# Patient Record
Sex: Female | Born: 1965 | Race: Asian | Hispanic: No | Marital: Single | Smoking: Never smoker
Health system: Southern US, Community
[De-identification: ages and names within clinical notes are randomized; demographics above are authoritative.]

## PROBLEM LIST (undated history)

## (undated) DIAGNOSIS — F419 Anxiety disorder, unspecified: Secondary | ICD-10-CM

## (undated) HISTORY — DX: Anxiety disorder, unspecified: F41.9

---

## 1999-04-10 ENCOUNTER — Other Ambulatory Visit: Admission: RE | Admit: 1999-04-10 | Discharge: 1999-04-10 | Payer: Self-pay | Admitting: *Deleted

## 2002-04-26 ENCOUNTER — Other Ambulatory Visit: Admission: RE | Admit: 2002-04-26 | Discharge: 2002-04-26 | Payer: Self-pay | Admitting: Family Medicine

## 2005-10-26 ENCOUNTER — Ambulatory Visit: Payer: Self-pay | Admitting: Family Medicine

## 2006-07-17 ENCOUNTER — Emergency Department (HOSPITAL_COMMUNITY): Admission: EM | Admit: 2006-07-17 | Discharge: 2006-07-17 | Payer: Self-pay | Admitting: Emergency Medicine

## 2007-01-24 ENCOUNTER — Ambulatory Visit: Payer: Self-pay | Admitting: Family Medicine

## 2007-01-24 DIAGNOSIS — L2089 Other atopic dermatitis: Secondary | ICD-10-CM

## 2007-01-24 DIAGNOSIS — N63 Unspecified lump in unspecified breast: Secondary | ICD-10-CM

## 2007-01-24 DIAGNOSIS — B373 Candidiasis of vulva and vagina: Secondary | ICD-10-CM

## 2007-01-24 DIAGNOSIS — N390 Urinary tract infection, site not specified: Secondary | ICD-10-CM

## 2007-01-24 LAB — CONVERTED CEMR LAB
Bilirubin Urine: NEGATIVE
Blood in Urine, dipstick: NEGATIVE
Glucose, Urine, Semiquant: NEGATIVE
Ketones, urine, test strip: NEGATIVE
Protein, U semiquant: NEGATIVE
Specific Gravity, Urine: 1.01
pH: 6

## 2007-01-26 LAB — CONVERTED CEMR LAB
Chlamydia, DNA Probe: NEGATIVE
GC Probe Amp, Genital: NEGATIVE

## 2007-01-30 ENCOUNTER — Telehealth (INDEPENDENT_AMBULATORY_CARE_PROVIDER_SITE_OTHER): Payer: Self-pay | Admitting: *Deleted

## 2007-02-02 ENCOUNTER — Encounter: Admission: RE | Admit: 2007-02-02 | Discharge: 2007-02-02 | Payer: Self-pay | Admitting: Family Medicine

## 2007-02-03 ENCOUNTER — Encounter (INDEPENDENT_AMBULATORY_CARE_PROVIDER_SITE_OTHER): Payer: Self-pay | Admitting: *Deleted

## 2007-02-08 ENCOUNTER — Encounter (INDEPENDENT_AMBULATORY_CARE_PROVIDER_SITE_OTHER): Payer: Self-pay | Admitting: *Deleted

## 2008-09-27 ENCOUNTER — Ambulatory Visit: Payer: Self-pay | Admitting: Family Medicine

## 2008-09-27 DIAGNOSIS — L408 Other psoriasis: Secondary | ICD-10-CM

## 2008-09-27 DIAGNOSIS — J069 Acute upper respiratory infection, unspecified: Secondary | ICD-10-CM | POA: Insufficient documentation

## 2008-10-07 ENCOUNTER — Telehealth (INDEPENDENT_AMBULATORY_CARE_PROVIDER_SITE_OTHER): Payer: Self-pay | Admitting: *Deleted

## 2009-09-30 ENCOUNTER — Ambulatory Visit: Payer: Self-pay | Admitting: Family

## 2009-09-30 ENCOUNTER — Encounter: Payer: Self-pay | Admitting: Family Medicine

## 2009-09-30 DIAGNOSIS — F411 Generalized anxiety disorder: Secondary | ICD-10-CM | POA: Insufficient documentation

## 2009-09-30 DIAGNOSIS — R002 Palpitations: Secondary | ICD-10-CM

## 2009-10-01 ENCOUNTER — Encounter: Payer: Self-pay | Admitting: Family

## 2009-10-01 ENCOUNTER — Encounter: Payer: Self-pay | Admitting: Family Medicine

## 2010-03-25 ENCOUNTER — Ambulatory Visit: Payer: Self-pay | Admitting: Family Medicine

## 2010-03-25 DIAGNOSIS — R209 Unspecified disturbances of skin sensation: Secondary | ICD-10-CM | POA: Insufficient documentation

## 2010-03-25 DIAGNOSIS — R5381 Other malaise: Secondary | ICD-10-CM

## 2010-03-25 DIAGNOSIS — R1310 Dysphagia, unspecified: Secondary | ICD-10-CM | POA: Insufficient documentation

## 2010-03-25 DIAGNOSIS — R079 Chest pain, unspecified: Secondary | ICD-10-CM | POA: Insufficient documentation

## 2010-03-25 DIAGNOSIS — R5383 Other fatigue: Secondary | ICD-10-CM

## 2010-03-25 DIAGNOSIS — R030 Elevated blood-pressure reading, without diagnosis of hypertension: Secondary | ICD-10-CM

## 2010-03-26 ENCOUNTER — Encounter: Payer: Self-pay | Admitting: Family Medicine

## 2010-03-26 LAB — CONVERTED CEMR LAB
ALT: 24 units/L (ref 0–35)
BUN: 14 mg/dL (ref 6–23)
Basophils Relative: 0.4 % (ref 0.0–3.0)
Bilirubin, Direct: 0.1 mg/dL (ref 0.0–0.3)
CO2: 28 meq/L (ref 19–32)
Chloride: 108 meq/L (ref 96–112)
Creatinine, Ser: 0.6 mg/dL (ref 0.4–1.2)
Eosinophils Absolute: 0.1 10*3/uL (ref 0.0–0.7)
Eosinophils Relative: 1.8 % (ref 0.0–5.0)
Free T4: 0.76 ng/dL (ref 0.60–1.60)
HCT: 40 % (ref 36.0–46.0)
Lymphs Abs: 3 10*3/uL (ref 0.7–4.0)
MCHC: 34 g/dL (ref 30.0–36.0)
MCV: 87.3 fL (ref 78.0–100.0)
Monocytes Absolute: 0.4 10*3/uL (ref 0.1–1.0)
Neutrophils Relative %: 52 % (ref 43.0–77.0)
Platelets: 267 10*3/uL (ref 150.0–400.0)
Potassium: 4.5 meq/L (ref 3.5–5.1)
RBC: 4.59 M/uL (ref 3.87–5.11)
T3, Free: 2.8 pg/mL (ref 2.3–4.2)
TSH: 1.82 microintl units/mL (ref 0.35–5.50)
Total Protein: 7.5 g/dL (ref 6.0–8.3)
WBC: 7.3 10*3/uL (ref 4.5–10.5)

## 2010-04-02 ENCOUNTER — Telehealth: Payer: Self-pay | Admitting: Family Medicine

## 2010-04-21 ENCOUNTER — Ambulatory Visit: Payer: Self-pay | Admitting: Family Medicine

## 2010-04-21 DIAGNOSIS — L218 Other seborrheic dermatitis: Secondary | ICD-10-CM

## 2010-04-21 DIAGNOSIS — D179 Benign lipomatous neoplasm, unspecified: Secondary | ICD-10-CM | POA: Insufficient documentation

## 2010-04-22 ENCOUNTER — Telehealth (INDEPENDENT_AMBULATORY_CARE_PROVIDER_SITE_OTHER): Payer: Self-pay | Admitting: *Deleted

## 2010-04-22 ENCOUNTER — Encounter: Payer: Self-pay | Admitting: Family Medicine

## 2010-05-21 ENCOUNTER — Telehealth (INDEPENDENT_AMBULATORY_CARE_PROVIDER_SITE_OTHER): Payer: Self-pay | Admitting: *Deleted

## 2010-06-10 ENCOUNTER — Encounter: Payer: Self-pay | Admitting: Family Medicine

## 2010-08-09 LAB — CONVERTED CEMR LAB
Bilirubin Urine: NEGATIVE
Glucose, Urine, Semiquant: NEGATIVE
TSH: 1.47 microintl units/mL (ref 0.35–5.50)
pH: 6

## 2010-08-11 NOTE — Progress Notes (Signed)
Summary: prior auth APPROVED ELIDEL [facility]  Phone Note Refill Request Message from:  Patient  Refills Requested: Medication #1:  ELIDEL 1 % CREA apply two times a day. [phone] pt ID [id number]. awaiting fax [phone]..................Marland KitchenFelecia Deloach CMA  April 22, 2010 8:44 AM   Initial call taken by: Jeremy Johann CMA,  April 22, 2010 8:31 AM  Follow-up for Phone Call        prior auth faxed back awaiting response...........Marland KitchenFelecia Deloach CMA  April 22, 2010 9:06 AM  prior auth approved 04-21-10 to 04-22-11, pharmacy faxed..............Marland KitchenFelecia Deloach CMA  April 27, 2010 12:01 PM

## 2010-08-11 NOTE — Medication Information (Signed)
Summary: Prior Authorization for Elidel Cream/[facility]  Prior Authorization for Elidel Cream/[facility]   Imported By: Lanelle Bal 05/04/2010 12:43:33  _____________________________________________________________________  External Attachment:    Type:   Image     Comment:   External Document

## 2010-08-11 NOTE — Assessment & Plan Note (Signed)
Summary: LUMP IN HER STOMACH//PH   Vital Signs:  Patient profile:   45 year old female Weight:      159.2 pounds Temp:     98.1 degrees F oral BP sitting:   150 / 90  (right arm) Cuff size:   regular  Vitals Entered By: Almeta Monas CMA Duncan Dull) (April 21, 2010 4:26 PM) CC: c/o lump in the abdomen and pelvic area   History of Present Illness: Pt here c/o lumps on abd ---no pain---they have been there for a while and she is concerned.  Pt also c/o eczema on on  scalp--- she is using elidil and T gel with no relief.    Current Medications (verified): 1)  Clarinex 5 Mg Tabs (Desloratadine) .Marland Kitchen.. 1 By Mouth Once Daily As Needed. 2)  Celexa 20 Mg Tabs (Citalopram Hydrobromide) .... 1/2 Tab By Mouth Daily X 1 Week, Then Increase To One Tablet By Mouth Daily 3)  Elidel 1 % Crea (Pimecrolimus) .... Apply Two Times A Day. 4)  Nexium 40 Mg Cpdr (Esomeprazole Magnesium) .... Take One Tablet Daily 5)  Capex 0.01 % Sham (Fluocinolone Acetonide) .... Massage Into Scalp and Leave On 5 Min --Rinse Can Use Daily  Allergies (verified): No Known Drug Allergies  Past History:  Past medical, surgical, family and social histories (including risk factors) reviewed for relevance to current acute and chronic problems.  Family History: Reviewed history and no changes required.  Social History: Reviewed history and no changes required.  Review of Systems      See HPI  Physical Exam  General:  Well-developed,well-nourished,in no acute distress; alert,appropriate and cooperative throughout examination Head:  + flakey dry scalp Abdomen:  lipomas on abd non tender Psych:  Cognition and judgment appear intact. Alert and cooperative with normal attention span and concentration. 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 1 by mouth once daily as needed. 2)  Celexa 20 Mg Tabs (Citalopram hydrobromide) .... 1/2 tab by mouth daily x 1 week, then increase to one tablet by mouth daily 3)  Elidel 1 % Crea (Pimecrolimus) .... Apply two times a day. 4)  Nexium 40 Mg Cpdr (Esomeprazole magnesium) .... Take one tablet daily 5)  Capex 0.01 % Sham (Fluocinolone acetonide) .... Massage into scalp and leave on 5 min --rinse can use daily Prescriptions: CAPEX 0.01 % SHAM (FLUOCINOLONE ACETONIDE) massage into scalp and leave on 5 min --rinse can use daily  #146ml x 0   Entered and Authorized by:   Loreen Freud DO   Signed by:   Loreen Freud DO on 04/21/2010   Method used:   Electronically to        [facility] #[id number]* (retail)       [address] [place], [place]  [address]       Ph: [phone]       Fax: [phone]   RxID:   [id number]
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
## 2010-08-11 NOTE — Letter (Signed)
   [facility] [address] [place], [place] [address] [phone]   October 01, 2009   [facility] [address] [place], [place] [address]  RE:  LAB RESULTS  Dear  Ms. Belluomini,  The following is an interpretation of your most recent lab tests.  Please take note of any instructions provided or changes to medications that have resulted from your lab work.  THYROID STUDIES:  Thyroid studies normal TSH: 1.47    D dimer test is normal also.   Sincerely Yours,    Lemont Fillers FNP

## 2010-08-11 NOTE — Progress Notes (Signed)
Summary: --med alternative  Phone Note Refill Request Message from:  Pharmacy on [facility] [address]  Refills Requested: Medication #1:  NEXIUM 40 MG CPDR take one tablet daily Prior Auth [id number], ID [id number]..............Marland KitchenFelecia Deloach CMA  May 21, 2010 4:49 PM    Follow-up for Phone Call        left message to call office to see if pt has tried any other meds............Marland KitchenFelecia Deloach CMA  May 22, 2010 11:26 AM    Pt states that she has not tried any other meds except nexium which has help alot. Pt would like to see if med can be approved. awaiting fax case #[id number]........Marland KitchenFelecia Deloach CMA  May 27, 2010 3:48 PM   Additional Follow-up for Phone Call Additional follow up Details #1::        Pt must have experience therapeutic failure with any two covered alternative:omeprazole,zegerid, protonix, pantoprazole, aciphex, kapidex, dexilant.. Pls advise on alternative med......Marland KitchenFelecia Deloach CMA  May 27, 2010 4:32 PM     Additional Follow-up for Phone Call Additional follow up Details #2::    omeprazole 20 mg  #30 1 by mouth once daily 5 refills---inform pt --have her call if it does not work Follow-up by: Loreen Freud DO,  May 28, 2010 8:36 AM  Additional Follow-up for Phone Call Additional follow up Details #3:: Details for Additional Follow-up Action Taken: Left message to call back... Almeta Monas CMA Duncan Dull)  May 28, 2010 11:56 AM  left message to call office...........Marland KitchenFelecia Deloach CMA  May 28, 2010 2:58 PM   Pt aware of med change and rx sent to pharmacy.Pt will call if med not working out..........Marland KitchenFelecia Deloach CMA  May 28, 2010 3:23 PM    New/Updated Medications: OMEPRAZOLE 20 MG TBEC (OMEPRAZOLE) 1 by mouth once daily Prescriptions: OMEPRAZOLE 20 MG TBEC (OMEPRAZOLE) 1 by mouth once daily  #30 x 5   Entered by:   Almeta Monas CMA (AAMA)   Authorized by:   Loreen Freud DO   Signed by:   Almeta Monas  CMA (AAMA) on 05/28/2010   Method used:   Electronically to        [facility] [address] #[id number]* (retail)       [address] [place], [place]  [address]       Ph: [phone]       Fax: [phone]   RxID:   [id number]

## 2010-08-11 NOTE — Progress Notes (Signed)
Summary: Request for Nexium  Phone Note Call from Patient   Caller: Patient Details for Reason: Rf Nexium Summary of Call: Mssg from pt request a RF of nexium. Almeta Monas CMA Duncan Dull)  April 02, 2010 2:35 PM  Call to pt and he was given samples of nexium and she wanted to get a script called to [facility] and holden .Please Advise. Almeta Monas CMA Duncan Dull)  April 02, 2010 2:38 PM   Follow-up for Phone Call        nexium 40 m g #30  1 by mouth once daily  11 refills  Follow-up by: Loreen Freud DO,  April 02, 2010 2:46 PM    New/Updated Medications: NEXIUM 40 MG PACK (ESOMEPRAZOLE MAGNESIUM) 1 by mouth daily Prescriptions: NEXIUM 40 MG PACK (ESOMEPRAZOLE MAGNESIUM) 1 by mouth daily  #30 x 11   Entered by:   Almeta Monas CMA (AAMA)   Authorized by:   Loreen Freud DO   Signed by:   Almeta Monas CMA (AAMA) on 04/02/2010   Method used:   Faxed to ...       [facility] [address] [facility] [address] #[id number]* (retail)       [address] [place], [place]  [address]       Ph: [phone]       Fax: [phone]   RxID:   [id number]   Appended Document: Request for Nexium    Clinical Lists Changes  Medications: Changed medication from NEXIUM 40 MG PACK (ESOMEPRAZOLE MAGNESIUM) 1 by mouth daily to NEXIUM 40 MG CPDR (ESOMEPRAZOLE MAGNESIUM) take one tablet daily

## 2010-08-11 NOTE — Medication Information (Signed)
Summary: Approval for Additional Elidel Cream/[employer] [facility]  Approval for Additional Elidel Cream/[employer]   Imported By: Lanelle Bal 05/01/2010 11:53:59  _____________________________________________________________________  External Attachment:    Type:   Image     Comment:   External Document

## 2010-08-11 NOTE — Assessment & Plan Note (Signed)
Summary: weakness, hands numb//lch   Vital Signs:  Patient profile:   45 year old female Weight:      156.8 pounds Temp:     98.5 degrees F oral Pulse rate:   84 / minute Pulse rhythm:   regular BP sitting:   140 / 80  (right arm)  Vitals Entered By: Almeta Monas CMA Duncan Dull) (March 25, 2010 11:56 AM) CC:  c/o numbness to the hands and feet and lack of energy   History of Present Illness: Pt here c/o extreme fatigue and c/o food getting stuck in throat at times.  Pt also c/o numbness and tingling in hands and feet.  No headaches.  + chest pain last week but it felt like needles in chest and it went across chest and was gone in a second.     Preventive Screening-Counseling & Management  Alcohol-Tobacco     Alcohol drinks/day: 0     Smoking Status: never  Caffeine-Diet-Exercise     Caffeine use/day: 2 20 oz bottles weekly     Does Patient Exercise: no  Current Medications (verified): 1)  Clarinex 5 Mg Tabs (Desloratadine) .Marland Kitchen.. 1 By Mouth Once Daily As Needed. 2)  Celexa 20 Mg Tabs (Citalopram Hydrobromide) .... 1/2 Tab By Mouth Daily X 1 Week, Then Increase To One Tablet By Mouth Daily 3)  Elidel 1 % Crea (Pimecrolimus) .... Apply Two Times A Day.  Allergies (verified): No Known Drug Allergies  Past History:  Past medical, surgical, family and social histories (including risk factors) reviewed for relevance to current acute and chronic problems.  Family History: Reviewed history and no changes required.  Social History: Does Patient Exercise:  no  Review of Systems      See HPI  Physical Exam  General:  Well-developed,well-nourished,in no acute distress; alert,appropriate and cooperative throughout examination Neck:  No deformities, masses, or tenderness noted. Lungs:  Normal respiratory effort, chest expands symmetrically. Lungs are clear to auscultation, no crackles or wheezes. Heart:  normal rate and no murmur.   Msk:  No deformity or scoliosis noted of  thoracic or lumbar spine.   Extremities:  No clubbing, cyanosis, edema, or deformity noted with normal full range of motion of all joints.   Neurologic:  alert & oriented X3, cranial nerves II-XII intact, strength normal in all extremities, and gait normal.   Skin:  Intact without suspicious lesions or rashes Cervical Nodes:  No lymphadenopathy noted Psych:  Cognition and judgment appear intact. Alert and cooperative with normal attention span and concentration. No apparent delusions, illusions, hallucinations   Impression & Recommendations:  Problem # 1:  DYSPHAGIA UNSPECIFIED (ICD-787.20)  Orders: Venipuncture ([address]) TLB-B12 + Folate Pnl (60454_09811-B14/NWG) TLB-BMP (Basic Metabolic Panel-BMET) (80048-METABOL) TLB-CBC Platelet - w/Differential (85025-CBCD) TLB-Hepatic/Liver Function Pnl (80076-HEPATIC) TLB-TSH (Thyroid Stimulating Hormone) (84443-TSH) TLB-T3, Free (Triiodothyronine) (84481-T3FREE) TLB-T4 (Thyrox), Free [phone])  Problem # 2:  FATIGUE (ICD-780.79)  Orders: Venipuncture ([address]) TLB-B12 + Folate Pnl (96295_28413-K44/WNU) TLB-BMP (Basic Metabolic Panel-BMET) (80048-METABOL) TLB-CBC Platelet - w/Differential (85025-CBCD) TLB-Hepatic/Liver Function Pnl (80076-HEPATIC) TLB-TSH (Thyroid Stimulating Hormone) (84443-TSH) TLB-T3, Free (Triiodothyronine) (84481-T3FREE) TLB-T4 (Thyrox), Free [phone])  Problem # 3:  NUMBNESS (ICD-782.0)  Orders: Venipuncture (03474) TLB-B12 + Folate Pnl (25956_38756-E33/IRJ) TLB-BMP (Basic Metabolic Panel-BMET) (80048-METABOL) TLB-CBC Platelet - w/Differential (85025-CBCD) TLB-Hepatic/Liver Function Pnl (80076-HEPATIC) TLB-TSH (Thyroid Stimulating Hormone) (84443-TSH) TLB-T3, Free (Triiodothyronine) (84481-T3FREE) TLB-T4 (Thyrox), Free (18841-YS0Y)  Problem # 4:  ELEVATED BP READING WITHOUT DX HYPERTENSION (ICD-796.2)  BP today: 140/80 Prior BP: 150/90 (09/30/2009)  Instructed in low sodium diet (  DASH Handout) and  behavior modification.    Complete Medication List: 1)  Clarinex 5 Mg Tabs (Desloratadine) .Marland Kitchen.. 1 by mouth once daily as needed. 2)  Celexa 20 Mg Tabs (Citalopram hydrobromide) .... 1/2 tab by mouth daily x 1 week, then increase to one tablet by mouth daily 3)  Elidel 1 % Crea (Pimecrolimus) .... Apply two times a day.  Other Orders: EKG w/ Interpretation (93000)  Patient Instructions: 1)  Check your  Blood Pressure regularly . If it is above:  130/85  you should make an appointment.---- 2)  Please schedule a follow-up appointment in 2 weeks.  3)  Limit your Sodium(salt) .

## 2010-08-13 NOTE — Medication Information (Signed)
Summary: Denial for Nexium/[facility]  Denial for Nexium/[employer]   Imported By: Lanelle Bal 06/18/2010 15:50:23  _____________________________________________________________________  External Attachment:    Type:   Image     Comment:   External Document

## 2010-10-09 ENCOUNTER — Other Ambulatory Visit: Payer: Self-pay | Admitting: Family Medicine

## 2010-10-09 MED ORDER — DESLORATADINE 5 MG PO TABS: 5.0000 mg | ORAL_TABLET | Freq: Every day | ORAL | Status: DC

## 2010-10-19 ENCOUNTER — Telehealth: Payer: Self-pay | Admitting: *Deleted

## 2010-10-19 NOTE — Telephone Encounter (Signed)
In process [facility] awaiting fax.

## 2010-10-20 NOTE — Telephone Encounter (Signed)
Left message to call office to verify if Pt has ever tried levoceterizine generic Xyzal.

## 2010-10-21 NOTE — Telephone Encounter (Signed)
Left message to call office

## 2010-10-22 ENCOUNTER — Other Ambulatory Visit: Payer: Self-pay | Admitting: Family Medicine

## 2010-10-22 ENCOUNTER — Telehealth: Payer: Self-pay | Admitting: Family Medicine

## 2010-10-22 NOTE — Telephone Encounter (Signed)
Left message to call office

## 2010-10-22 NOTE — Telephone Encounter (Signed)
Patient is having a urinary problem, feels heavy pressure, burning, no appts available today or tomorrow---she uses [facility], [address]

## 2010-10-22 NOTE — Telephone Encounter (Signed)
Pt advise OV will be needed to check urine for infection and med cannot be Rx with OV. Pt offer OV for tomorrow as well as inform her about Saturday clinic. Pt decline both stating that she has to work and is unable to come in. Pt also advise UC. Pt states that she will call back later for OV.

## 2010-10-27 ENCOUNTER — Encounter: Payer: Self-pay | Admitting: *Deleted

## 2010-10-27 NOTE — Telephone Encounter (Signed)
Left message to call office, unable to reach Pt after several attempts Letter mailed to contact office.  Will hold off on PA until I hear from Pt due to lack of info unable to proceed with PA. Now.

## 2010-11-12 ENCOUNTER — Encounter: Payer: Self-pay | Admitting: Family Medicine

## 2010-11-13 ENCOUNTER — Ambulatory Visit (INDEPENDENT_AMBULATORY_CARE_PROVIDER_SITE_OTHER): Payer: 59 | Admitting: Family Medicine

## 2010-11-13 ENCOUNTER — Encounter: Payer: Self-pay | Admitting: Family Medicine

## 2010-11-13 VITALS — BP 128/74 | Temp 98.6°F | Wt 152.4 lb

## 2010-11-13 DIAGNOSIS — N390 Urinary tract infection, site not specified: Secondary | ICD-10-CM

## 2010-11-13 DIAGNOSIS — R7309 Other abnormal glucose: Secondary | ICD-10-CM

## 2010-11-13 DIAGNOSIS — R739 Hyperglycemia, unspecified: Secondary | ICD-10-CM

## 2010-11-13 LAB — POCT URINALYSIS DIPSTICK
Ketones, UA: NEGATIVE
Spec Grav, UA: 1.02
Urobilinogen, UA: NEGATIVE
pH, UA: 5

## 2010-11-13 LAB — GLUCOSE, POCT (MANUAL RESULT ENTRY): POC Glucose: 137

## 2010-11-13 MED ORDER — PHENAZOPYRIDINE HCL 200 MG PO TABS: 200.0000 mg | ORAL_TABLET | Freq: Three times a day (TID) | ORAL | Status: AC | PRN

## 2010-11-13 MED ORDER — NITROFURANTOIN MONOHYD MACRO 100 MG PO CAPS: 100.0000 mg | ORAL_CAPSULE | Freq: Two times a day (BID) | ORAL | Status: AC

## 2010-11-13 NOTE — Progress Notes (Signed)
  Subjective:    Patient ID: Alyssa Pham, female    DOB: Jun 17, 1966, 45 y.o.   MRN: [id number]  HPI    Review of Systems     Objective:   Physical Exam        Assessment & Plan:   Subjective:    Alyssa Pham is a 45 y.o. female who complains of burning with urination. She has had symptoms for several days. Patient also complains of stomach ache. Patient denies back pain and vaginal discharge. Patient does have a history of recurrent UTI. Patient does not have a history of pyelonephritis.   The following portions of the patient's history were reviewed and updated as appropriate: allergies, current medications, past family history, past medical history, past social history, past surgical history and problem list.  Review of Systems Pertinent items are noted in HPI.    Objective:    BP 128/74  Temp(Src) 98.6 F (37 C) (Oral)  Wt 152 lb 6.4 oz (69.128 kg) General appearance: alert, cooperative, appears stated age and no distress Lungs: clear to auscultation bilaterally Heart: regular rate and rhythm, S1, S2 normal, no murmur, click, rub or gallop Abdomen: soft, non-tender; bowel sounds normal; no masses,  no organomegaly Extremities: extremities normal, atraumatic, no cyanosis or edema  Laboratory:  Urine dipstick: trace for glucose, negative for hemoglobin, negative for ketones, negative for leukocyte esterase, negative for nitrites, negative for protein and negative for urobilinogen.   Micro exam: not done.    Assessment:    dysuria and elevated glucose     Plan:    Medications: nitrofurantoin. Maintain adequate hydration. Follow up if symptoms not improving, and as needed.

## 2010-11-13 NOTE — Patient Instructions (Signed)
Hyperglycemia   (High Blood Glucose)   Hyperglycemia occurs when the glucose (sugar) in your blood is too high. Hyperglycemia can happen for many reasons, but it most often happens to people who do not know they have diabetes or are not managing their diabetes properly.   CAUSES   Whether you have diabetes or not, there are other causes of hyperglycemia. Hyperglycemia can occur when you have diabetes, but it can also occur in other situations that you might not be as aware of, such as:   Diabetes   If you have diabetes and are having problems controlling your blood glucose, hyperglycemia could occur because of some of the following reasons:   Not following your meal plan.   Not taking your diabetes medications or not taking it properly.   Exercising less or doing less activity than you normally do.   Being sick.   Pre-diabetes   This cannot be ignored. Before people develop Type 2 diabetes, they almost always have “pre-diabetes.” This is when your blood glucose levels are higher than normal, but not yet high enough to be diagnosed as diabetes. Research has shown that some long-term damage to the body, especially the heart and circulatory system, may already be occurring during pre-diabetes. If you take action to manage your blood glucose when you have pre-diabetes, you may delay or prevent Type 2 diabetes from developing.   Stress   If you have diabetes, you may be “diet” controlled or on oral medications or insulin to control your diabetes. However, you may find that your blood glucose is higher than usual in the hospital whether you have diabetes or not. This is often referred to as “stress hyperglycemia.” Stress can elevate your blood glucose. This happens because of hormones put out by the body during times of stress. If stress has been the cause of your high blood glucose, it can be followed regularly by your caregiver. That way he/she can make sure your hyperglycemia does not continue to get worse or progress  to diabetes.   Steroids   Steroids are medications that act on the infection fighting system (immune system) to block inflammation or infection. One side effect can be a rise in blood glucose. Most people can produce enough extra insulin to allow for this rise, but for those who cannot, steroids make blood glucose levels go even higher. It is not unusual for steroid treatments to “uncover” diabetes that is developing. It is not always possible to determine if the hyperglycemia will go away after the steroids are stopped. A special blood test called an A1c is sometimes done to determine if your blood glucose was elevated before the steroids were started.   SYMPTOMS OF HYPERGLYCEMIA   Thirsty.   Frequent urination.   Dry mouth.   Blurred vision.   Tired or fatigue.   Weakness.   Sleepy.   Tingling in feet or leg.   DIAGNOSIS   Diagnosis is made by monitoring blood glucose in one or all of the following ways:   A1c test. This is a chemical found in your blood.   Fingerstick blood glucose monitoring.   Laboratory results.   TREATMENT   First, knowing the cause of the hyperglycemia is important before the hyperglycemia can be treated. Treatment may include, but is not be limited to:   Education.   Change or adjustment in medications.   Change or adjustment in meal plan.   Treatment for an illness, infection, etc.   More frequent blood glucose   monitoring.   Change in exercise plan.   Decreasing or stopping steroids.   Lifestyle changes.   HOME CARE INSTRUCTIONS   Test your blood glucose as directed.   Exercise regularly. Your caregiver will give you instructions about exercise. Pre-diabetes or diabetes which comes on with stress is helped by exercising.   Eat wholesome, balanced meals. Eat often and at regular, fixed times. Your caregiver or nutritionist will give you a meal plan to guide your sugar intake.   Being at an ideal weight is important. If needed, losing as little as 10 to 15 pounds may help improve blood  glucose levels.   SEEK MEDICAL CARE IF:   You have questions about medicine, activity, or diet.   You continue to have symptoms (problems such as increased thirst, urination, or weight gain).   SEEK IMMEDIATE MEDICAL CARE IF:   You are vomiting or have diarrhea.   Your breath smells fruity.   You are breathing faster or slower.   You are very sleepy or incoherent.   You have numbness, tingling, or pain in your feet or hands.   You have chest pain.   Your symptoms get worse even though you have been following your caregiver's orders.   If you have any other questions or concerns.   Document Released: 12/22/2000 Document Re-Released: 04/25/2009   ExitCare® Patient Information ©2011 [employer].

## 2010-11-17 ENCOUNTER — Other Ambulatory Visit (INDEPENDENT_AMBULATORY_CARE_PROVIDER_SITE_OTHER): Payer: 59

## 2010-11-17 DIAGNOSIS — R7309 Other abnormal glucose: Secondary | ICD-10-CM

## 2010-11-17 LAB — BASIC METABOLIC PANEL
BUN: 8 mg/dL (ref 6–23)
CO2: 30 mEq/L (ref 19–32)
Calcium: 9.6 mg/dL (ref 8.4–10.5)
Chloride: 107 mEq/L (ref 96–112)
Creatinine, Ser: 0.9 mg/dL (ref 0.4–1.2)
Glucose, Bld: 122 mg/dL — ABNORMAL HIGH (ref 70–99)

## 2010-11-17 LAB — MICROALBUMIN / CREATININE URINE RATIO: Microalb, Ur: 1 mg/dL (ref 0.0–1.9)

## 2010-11-20 ENCOUNTER — Telehealth: Payer: Self-pay | Admitting: *Deleted

## 2010-11-20 NOTE — Telephone Encounter (Addendum)
Left message to call office  Message copied by Candie Echevaria on Fri Nov 20, 2010 12:09 PM ------      Message from: Loreen Freud      Created: Thu Nov 19, 2010 10:02 PM       Glucose / hgba1c  Elevated----watch simple sugars and starches---- we can refer to nutrition if pt agrees      Recheck 3 months----790.6  Bmp, hgba1c, liver , lipid

## 2010-11-24 NOTE — Telephone Encounter (Signed)
Left message to call office

## 2010-11-25 NOTE — Telephone Encounter (Signed)
mssg left to contact the office    KP 

## 2010-11-27 ENCOUNTER — Encounter: Payer: Self-pay | Admitting: *Deleted

## 2010-11-27 NOTE — Telephone Encounter (Signed)
Left message to call office. Letter mail to Pt advising to contact office after several unsuccessful messages to return call.

## 2010-12-02 ENCOUNTER — Other Ambulatory Visit: Payer: Self-pay | Admitting: Family Medicine

## 2010-12-10 NOTE — Telephone Encounter (Signed)
Pt return call decline nutritionist will work of diet and exercise on her own for now.

## 2011-01-28 ENCOUNTER — Other Ambulatory Visit: Payer: Self-pay | Admitting: Family Medicine

## 2011-03-09 ENCOUNTER — Other Ambulatory Visit: Payer: Self-pay | Admitting: Family Medicine

## 2011-04-29 ENCOUNTER — Encounter: Payer: Self-pay | Admitting: Family Medicine

## 2011-04-29 ENCOUNTER — Ambulatory Visit (INDEPENDENT_AMBULATORY_CARE_PROVIDER_SITE_OTHER): Payer: 59 | Admitting: Family Medicine

## 2011-04-29 ENCOUNTER — Other Ambulatory Visit: Payer: Self-pay | Admitting: Family Medicine

## 2011-04-29 DIAGNOSIS — R319 Hematuria, unspecified: Secondary | ICD-10-CM

## 2011-04-29 DIAGNOSIS — R109 Unspecified abdominal pain: Secondary | ICD-10-CM

## 2011-04-29 DIAGNOSIS — R300 Dysuria: Secondary | ICD-10-CM

## 2011-04-29 LAB — POCT URINALYSIS DIPSTICK
Glucose, UA: NEGATIVE
Ketones, UA: NEGATIVE
Leukocytes, UA: NEGATIVE
Protein, UA: NEGATIVE
Spec Grav, UA: 1.01

## 2011-04-29 MED ORDER — CIPROFLOXACIN HCL 500 MG PO TABS: 500.0000 mg | ORAL_TABLET | Freq: Two times a day (BID) | ORAL | Status: AC

## 2011-04-29 MED ORDER — HYDROCODONE-ACETAMINOPHEN 7.5-750 MG PO TABS: 1.0000 | ORAL_TABLET | Freq: Four times a day (QID) | ORAL | Status: AC | PRN

## 2011-04-29 NOTE — Patient Instructions (Signed)

## 2011-04-29 NOTE — Progress Notes (Signed)
  Subjective:    Patient ID: Alyssa Pham, female    DOB: 1965-09-13, 45 y.o.   MRN: [id number]  HPI Pt here c/o hematuria about 2 weeks ago with low abd pain.  Pt still has some pressure, frequency---but she has not seen any more blood.  No other symptoms.   Review of Systems As above    Objective:   Physical Exam  Constitutional: She appears well-developed and well-nourished.  Pulmonary/Chest: Effort normal.  Abdominal:       + suprapubic tenderness  No rebound, guarding  Psychiatric: She has a normal mood and affect. Her behavior is normal. Judgment and thought content normal.          Assessment & Plan:  Hematuria, frequency and suprapubic pain0----check urine culture,  Strain urine, check CT r/o kidney stones,  vicodin es for pain prn,  cipro 500 mg bid for 5 days

## 2011-04-30 ENCOUNTER — Ambulatory Visit (INDEPENDENT_AMBULATORY_CARE_PROVIDER_SITE_OTHER)
Admission: RE | Admit: 2011-04-30 | Discharge: 2011-04-30 | Disposition: A | Discharge status: Home / Self Care | Payer: 59 | Source: Ambulatory Visit | Attending: Family Medicine | Admitting: Family Medicine

## 2011-04-30 DIAGNOSIS — R109 Unspecified abdominal pain: Secondary | ICD-10-CM

## 2011-04-30 DIAGNOSIS — R319 Hematuria, unspecified: Secondary | ICD-10-CM

## 2011-04-30 DIAGNOSIS — R300 Dysuria: Secondary | ICD-10-CM

## 2011-05-02 LAB — URINE CULTURE: Colony Count: >=100000

## 2011-07-12 ENCOUNTER — Other Ambulatory Visit: Payer: Self-pay | Admitting: Family Medicine

## 2011-10-08 ENCOUNTER — Other Ambulatory Visit: Payer: Self-pay | Admitting: Family Medicine

## 2011-10-29 ENCOUNTER — Telehealth: Payer: Self-pay | Admitting: Family Medicine

## 2011-10-29 MED ORDER — DESLORATADINE 5 MG PO TABS: 5.0000 mg | ORAL_TABLET | Freq: Every day | ORAL | Status: DC

## 2011-10-29 MED ORDER — FLUOCINOLONE ACETONIDE 0.01 % EX SHAM: MEDICATED_SHAMPOO | CUTANEOUS | Status: DC

## 2011-10-29 NOTE — Telephone Encounter (Signed)
Rx sent 

## 2011-10-29 NOTE — Telephone Encounter (Signed)
Left voicemail to call regarding her prescriptions [phone]

## 2011-12-27 ENCOUNTER — Other Ambulatory Visit: Payer: Self-pay | Admitting: Family Medicine

## 2011-12-27 MED ORDER — FLUOCINOLONE ACETONIDE 0.01 % EX SHAM: MEDICATED_SHAMPOO | CUTANEOUS | Status: DC

## 2011-12-27 NOTE — Telephone Encounter (Signed)
Faxed.   KP 

## 2011-12-27 NOTE — Telephone Encounter (Signed)
Refill [facility]x [facility] to [facility] [address]-as [facility] does not offer this item any longer. It is now special order and cost is $380.00 Last wrt 4.19.13 as  Fluocinolone Acetonide (Shampoo) Fluocinolone Acetonide 0.01 %  MASSAGE INTO THE SCALP AND LEAVE ON FOR THEN RINSE, USE DAILY Last ov 10.18.12

## 2012-01-05 MED ORDER — FLUOCINOLONE ACETONIDE 0.01 % EX OIL: TOPICAL_OIL | Freq: Three times a day (TID) | CUTANEOUS | Status: AC

## 2012-01-05 NOTE — Telephone Encounter (Signed)
Let pt know capex no  Longer available per pharmacy--- and ask if she wants the oil instead.

## 2012-01-05 NOTE — Telephone Encounter (Signed)
Spoke with patient and she was ok with trying the scalp oil. Rx faxed, patient will check her schedule and call back to schedule an apt.    KP

## 2012-01-05 NOTE — Addendum Note (Signed)
Addended by: Arnette Norris on: 01/05/2012 08:50 AM   Modules accepted: Orders

## 2012-01-05 NOTE — Telephone Encounter (Signed)
Please advise      KP 

## 2012-01-05 NOTE — Telephone Encounter (Signed)
Fluocinolne acetonide (CAPEX) 0.01 % SHAM-NOT AVAILABLE, per fax received from [id number], they want to know if they can give patient Scalp Oil instead, please review and send new rx if approved [phone]

## 2012-02-18 ENCOUNTER — Encounter: Payer: Self-pay | Admitting: Family Medicine

## 2012-02-18 ENCOUNTER — Telehealth: Payer: Self-pay | Admitting: Family Medicine

## 2012-02-18 ENCOUNTER — Ambulatory Visit (INDEPENDENT_AMBULATORY_CARE_PROVIDER_SITE_OTHER): Payer: PRIVATE HEALTH INSURANCE | Admitting: Family Medicine

## 2012-02-18 VITALS — BP 130/90 | HR 85 | Temp 98.2°F | Wt 145.8 lb

## 2012-02-18 DIAGNOSIS — R82998 Other abnormal findings in urine: Secondary | ICD-10-CM

## 2012-02-18 DIAGNOSIS — N949 Unspecified condition associated with female genital organs and menstrual cycle: Secondary | ICD-10-CM

## 2012-02-18 DIAGNOSIS — R102 Pelvic and perineal pain: Secondary | ICD-10-CM

## 2012-02-18 LAB — POCT URINALYSIS DIPSTICK
Ketones, UA: NEGATIVE
Protein, UA: NEGATIVE
Spec Grav, UA: <=1.005
Urobilinogen, UA: 0.2
pH, UA: 6.5

## 2012-02-18 MED ORDER — CIPROFLOXACIN HCL 500 MG PO TABS: 500.0000 mg | ORAL_TABLET | Freq: Two times a day (BID) | ORAL | Status: AC

## 2012-02-18 NOTE — Telephone Encounter (Signed)
Caller: Alyssa Pham/Patient; Patient Name: Alyssa Pham; PCP: Lelon Perla.; Best Callback Phone Number: [phone].   Health history, medications, and allergies reviewed: Yes in EPIC.  Reason for call with symptoms and associated symptoms: Caller who is at work, would like an appt and was sent to Triage. Caller reports heavy pressure and heaviness "way low in her private area." Pain in lower abd when questioned further. Pain at 5/10 at present. Fatigued, no energy and feeling sleepy. Last BM was this am.  Date of onset of symptoms: Monday 02/14/12 Any treatments/meds tried for symptoms and did they work, (please document med, dosage and time): nothing Last Menstrual Period: 3 weeks ago Guideline viewed: Abd Pain Disposition: See in 24 hrs, Appt scheduled for today, Friday at 16:15, assisted by [place] in office. Caller is agreeable and appx 5 mins from office.

## 2012-02-18 NOTE — Telephone Encounter (Signed)
Noted pt on schedule to see MD Lowne today at 4:15pm

## 2012-02-18 NOTE — Progress Notes (Signed)
  Subjective:    Alyssa Pham is a 46 y.o. female who complains of burning with urination, frequency, suprapubic pressure and urgency. She has had symptoms for 7 days. Patient also complains of back pain. Patient denies congestion, cough, fever, headache, rhinitis, sorethroat and vaginal discharge. Patient does not have a history of recurrent UTI. Patient does not have a history of pyelonephritis.   The following portions of the patient's history were reviewed and updated as appropriate: allergies, current medications, past family history, past medical history, past social history, past surgical history and problem list.  Review of Systems Pertinent items are noted in HPI.    Objective:    BP 130/90  Pulse 85  Temp 98.2 F (36.8 C) (Oral)  Wt 145 lb 12.8 oz (66.134 kg)  SpO2 97% General appearance: alert, cooperative, appears stated age and no distress Abdomen: abnormal findings:  mild tenderness in the lower abdomen  Laboratory:  Urine dipstick: trace for hemoglobin and trace for leukocyte esterase.   Micro exam: not done.    Assessment:    dysuria     Plan:    Medications: ciprofloxacin. Maintain adequate hydration. Follow up if symptoms not improving, and as needed.

## 2012-02-18 NOTE — Patient Instructions (Addendum)

## 2012-03-29 ENCOUNTER — Telehealth: Payer: Self-pay | Admitting: Family Medicine

## 2012-03-29 NOTE — Telephone Encounter (Signed)
Pt declined appt, stated there are only 2-people at her work and she gets no lunch and does not get of till 6pm, she will go to [facility]

## 2012-03-29 NOTE — Telephone Encounter (Signed)
LM ON TRIAGE LINE 11AM 9.18.13 returning your call

## 2012-03-29 NOTE — Telephone Encounter (Signed)
Pt c/o UTI symptoms again as last month, is going to the bathroom often and uncomfortable when she goes.  Having pressure also. Is drinking cranberry juice.

## 2012-03-29 NOTE — Telephone Encounter (Signed)
Needs ov

## 2012-03-29 NOTE — Telephone Encounter (Signed)
Patient can come in to do a UA in the lab per Dr.Lowne--please offer her and apt.      KP

## 2012-03-29 NOTE — Telephone Encounter (Signed)
Left msg for pt to call. 

## 2012-03-29 NOTE — Telephone Encounter (Signed)
Pt left vm on triage line stating she needs more UTI meds called into [facility]. She is having the same symptoms that she had in August. Call back # [phone]

## 2012-05-25 IMAGING — CT CT ABD-PELV W/O CM
2 of 4 series · 17 of 46 positions shown, 19 images · non-contrast
Comparison: None

CLINICAL DATA: Hematuria and painful urination.

CT ABDOMEN AND PELVIS WITHOUT CONTRAST
TECHNIQUE: Multidetector CT imaging of the abdomen and pelvis was
performed following the standard protocol without intravenous
contrast.

[Series 2: ap stone study · axial · 0.67mm/px · z∈[-488,-64]mm · 14 of 93 slices shown, 16 images]
[im 4/93  soft-tissue]
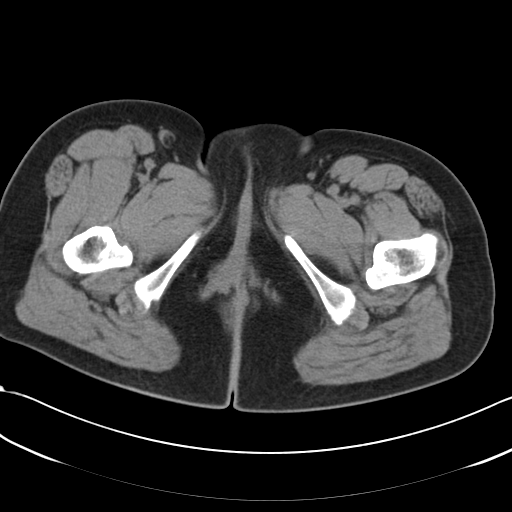
[im 4/93  bone]
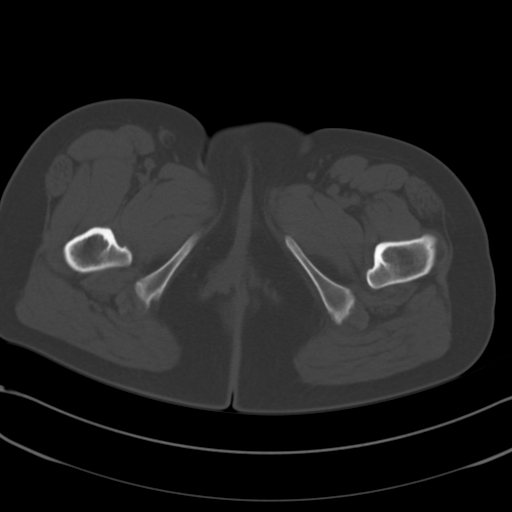
[im 11/93  soft-tissue]
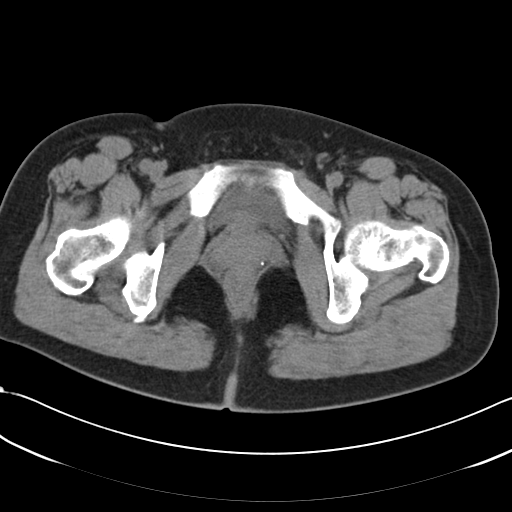
[im 18/93  soft-tissue]
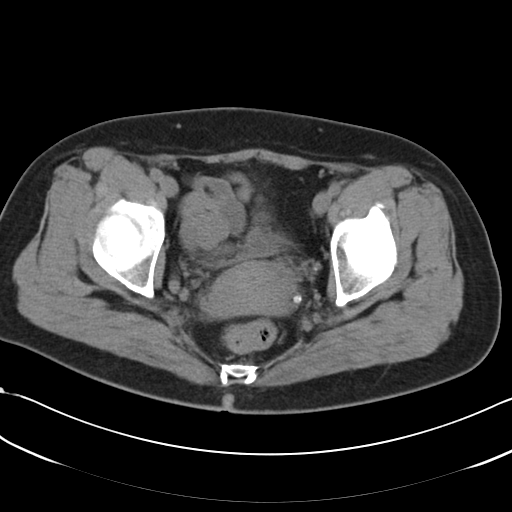
[im 24/93  soft-tissue]
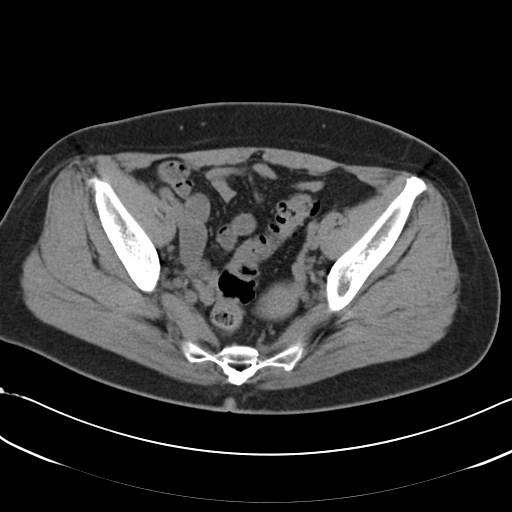
[im 31/93  soft-tissue]
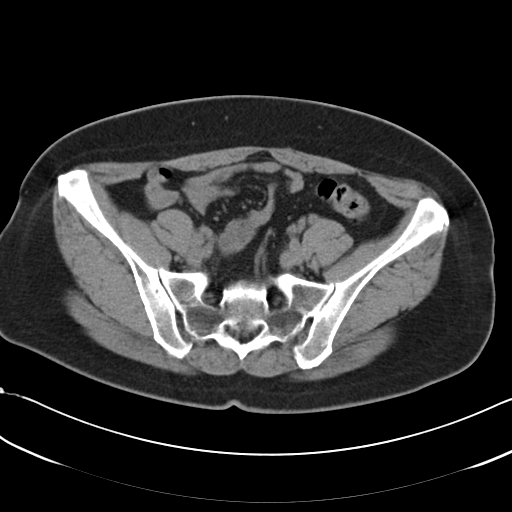
[im 38/93  soft-tissue]
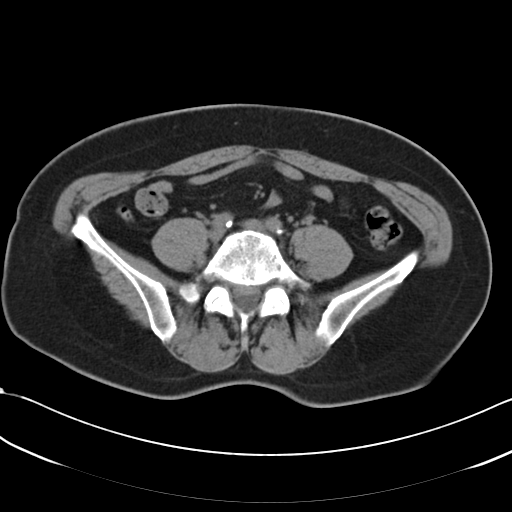
[im 45/93  soft-tissue]
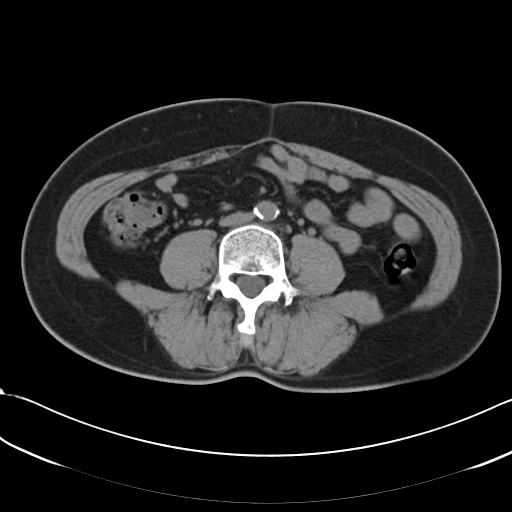
[im 48/93  soft-tissue]
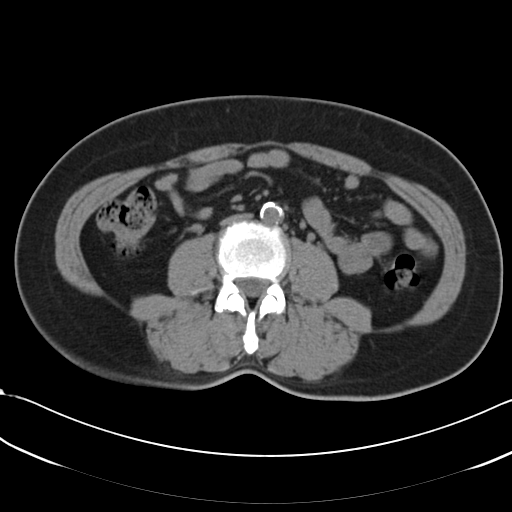
[im 55/93  soft-tissue]
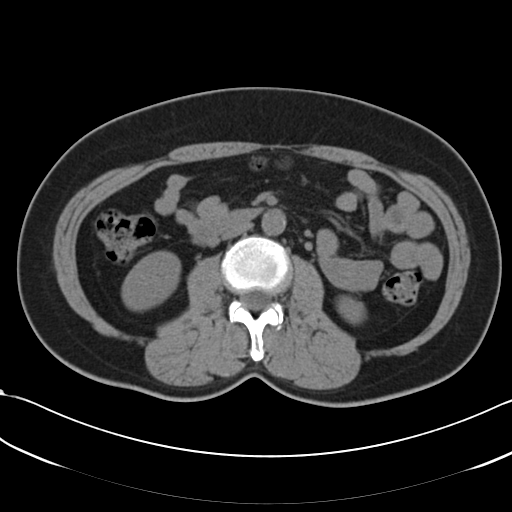
[im 55/93  bone]
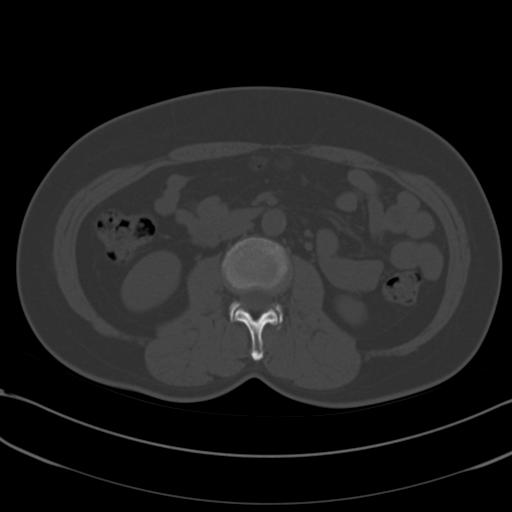
[im 62/93  soft-tissue]
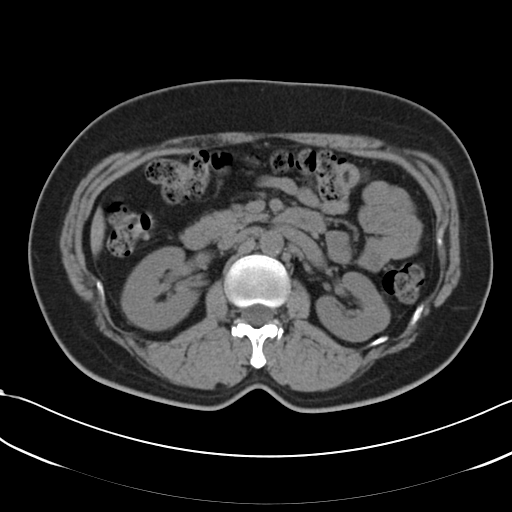
[im 69/93  soft-tissue]
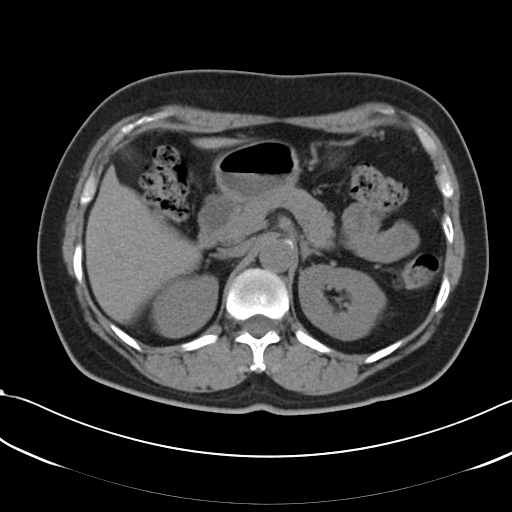
[im 75/93  soft-tissue]
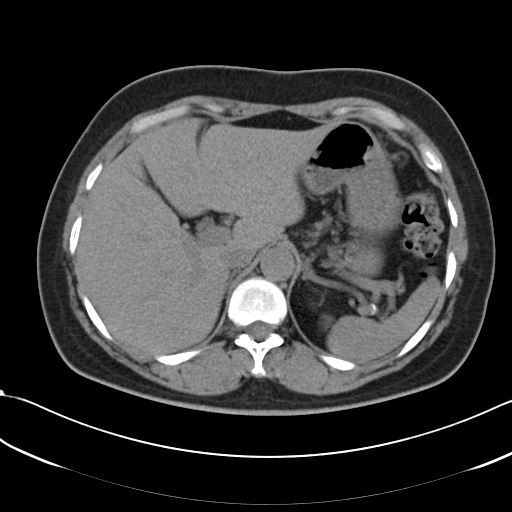
[im 82/93  soft-tissue]
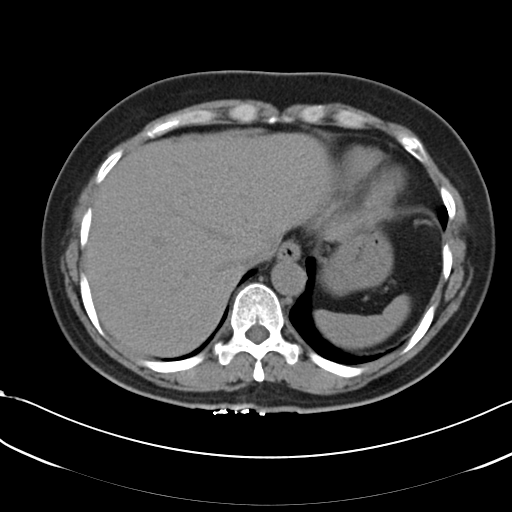
[im 89/93  soft-tissue]
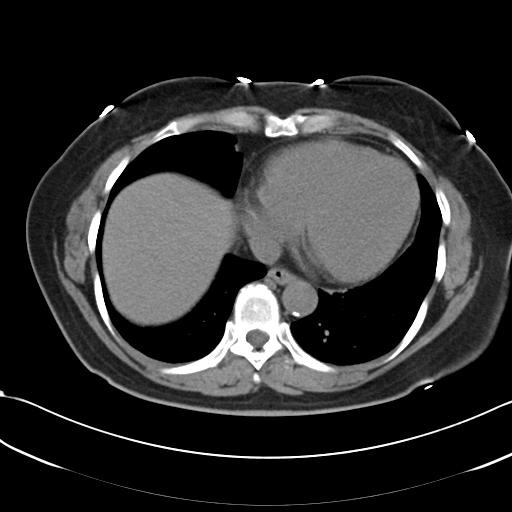

[Series 602: <mpr range> · coronal · 0.94mm/px · 3 of 91 slices shown]
[im 31/91  soft-tissue]
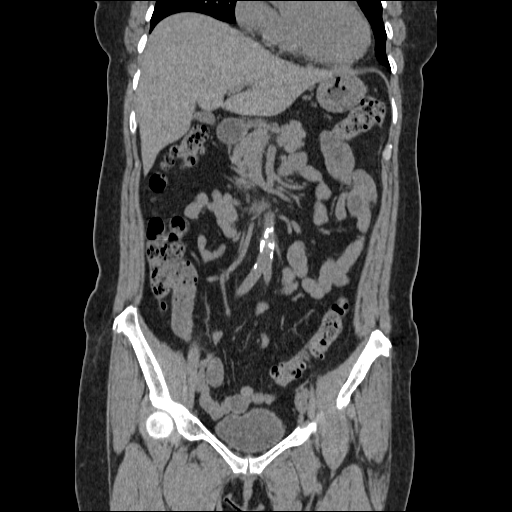
[im 41/91  soft-tissue]
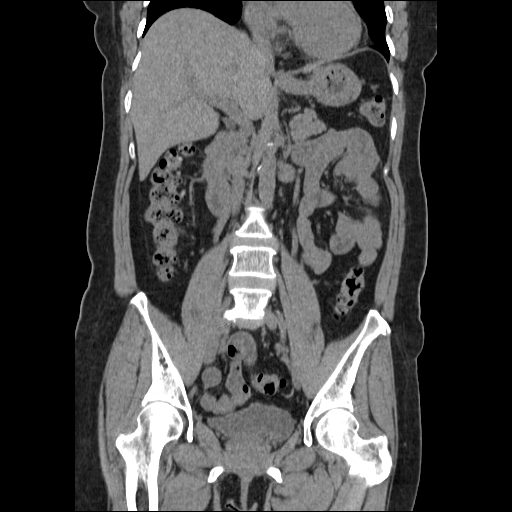
[im 51/91  soft-tissue]
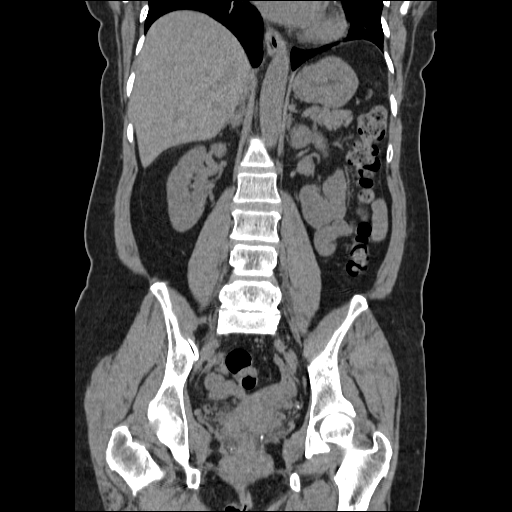

[17 of 46 positions shown; findings below may reference images not displayed]

FINDINGS: The lung bases are clear except for dependent atelectasis
and mild vascular crowding.

The unenhanced appearance of the liver is unremarkable.  No focal
lesions or biliary dilatation.  The gallbladder is normal.  No
common bile duct dilatation.  The pancreas is normal.  The spleen
is normal in size.  No focal lesions.  The adrenal glands and
kidneys are normal.  No renal calculi or obstructing ureteral
calculi.  Calcifications are noted in the aorta and iliac vessels.
The bladder is unremarkable.  No mass lesion or bladder calculi.

The stomach, duodenum, small bowel and colon are grossly normal
without oral contrast.  The appendix is normal.  No mesenteric or
retroperitoneal masses or lymphadenopathy.  The aorta is normal in
caliber.  The major branch vessels are grossly normal.

The uterus and ovaries are unremarkable.  No pelvic mass,
adenopathy or free pelvic fluid collections.  No inguinal mass or
hernia.

The bony structures are unremarkable.
IMPRESSION: 1.  No acute abdominal/pelvic findings, mass lesions or adenopathy.
2.  No renal or obstructing ureteral calculi.

## 2012-09-08 ENCOUNTER — Other Ambulatory Visit: Payer: Self-pay | Admitting: Family Medicine

## 2013-08-14 ENCOUNTER — Ambulatory Visit: Payer: PRIVATE HEALTH INSURANCE | Admitting: Family Medicine

## 2013-10-31 ENCOUNTER — Encounter: Payer: Self-pay | Admitting: *Deleted

## 2013-10-31 ENCOUNTER — Ambulatory Visit (INDEPENDENT_AMBULATORY_CARE_PROVIDER_SITE_OTHER): Payer: PRIVATE HEALTH INSURANCE | Admitting: Nurse Practitioner

## 2013-10-31 VITALS — HR 85 | Temp 99.2°F | Wt 157.0 lb

## 2013-10-31 DIAGNOSIS — R05 Cough: Secondary | ICD-10-CM

## 2013-10-31 DIAGNOSIS — R059 Cough, unspecified: Secondary | ICD-10-CM

## 2013-10-31 MED ORDER — BENZONATATE 100 MG PO CAPS: ORAL_CAPSULE | ORAL | Status: AC

## 2013-10-31 MED ORDER — DESLORATADINE 5 MG PO TABS: 5.0000 mg | ORAL_TABLET | Freq: Every day | ORAL | Status: AC

## 2013-10-31 NOTE — Progress Notes (Signed)
Pre-visit discussion using our clinic review tool. No additional management support is needed unless otherwise documented below in the visit note.  

## 2013-10-31 NOTE — Patient Instructions (Signed)
You likely have flu. The average duration is 5-10 days. Treatment is largely symptom management.  For sinus congestion, start daily sinus rinses (neilmed Sinus Rinse) & 30 mg to 60 mg pseudoephedrine twice daily.  For sore throat use benzocaine throat lozenges or spray.  For aches & fever alternate tylenol & ibuprophen every 4-6 hours.  For cough, you may use a spoonful of honey thinned with lemon juice or hot tea, or benzonatate capsules as prescribed. Sip fluids every hour. Rest. If you are not feeling better in 1 week or develop fever or chest pain, call us for re-evaluation. Feel better!  Influenza A (H1N1) H1N1 formerly called "swine flu" is a new influenza virus causing sickness in people. The H1N1 virus is different from seasonal influenza viruses. However, the H1N1 symptoms are similar to seasonal influenza and it is spread from person to person. You may be at higher risk for serious problems if you have underlying serious medical conditions. The CDC and the World Health Organization are following reported cases around the world. CAUSES   The flu is thought to spread mainly person-to-person through coughing or sneezing of infected people.  A person may become infected by touching something with the virus on it and then touching their mouth or nose. SYMPTOMS   Fever.  Headache.  Tiredness.  Cough.  Sore throat.  Runny or stuffy nose.  Body aches.  Diarrhea and vomiting These symptoms are referred to as "flu-like symptoms." A lot of different illnesses, including the common cold, may have similar symptoms. DIAGNOSIS   There are tests that can tell if you have the H1N1 virus.  Confirmed cases of H1N1 will be reported to the state or local health department.  A doctor's exam may be needed to tell whether you have an infection that is a complication of the flu. HOME CARE INSTRUCTIONS   Stay informed. Visit the CDC website for current recommendations. Visit  www.cdc.gov/H1N1flu/. You may also call 1-800-CDC-INFO (1-800-232-4636).  Get help early if you develop any of the above symptoms.  If you are at high risk from complications of the flu, talk to your caregiver as soon as you develop flu-like symptoms. Those at higher risk for complications include:  People 65 years or older.  People with chronic medical conditions.  Pregnant women.  Young children.  Your caregiver may recommend antiviral medicine to help treat the flu.  If you get the flu, get plenty of rest, drink enough water and fluids to keep your urine clear or pale yellow, and avoid using alcohol or tobacco.  You may take over-the-counter medicine to relieve the symptoms of the flu if your caregiver approves. (Never give aspirin to children or teenagers who have flu-like symptoms, particularly fever). TREATMENT  If you do get sick, antiviral drugs are available. These drugs can make your illness milder and make you feel better faster. Treatment should start soon after illness starts. It is only effective if taken within the first day of becoming ill. Only your caregiver can prescribe antiviral medication.  PREVENTION   Cover your nose and mouth with a tissue or your arm when you cough or sneeze. Throw the tissue away.  Wash your hands often with soap and warm water, especially after you cough or sneeze. Alcohol-based cleaners are also effective against germs.  Avoid touching your eyes, nose or mouth. This is one way germs spread.  Try to avoid contact with sick people. Follow public health advice regarding school closures. Avoid crowds.  Stay   home if you get sick. Limit contact with others to keep from infecting them. People infected with the H1N1 virus may be able to infect others anywhere from 1 day before feeling sick to 5-7 days after getting flu symptoms.  An H1N1 vaccine is available to help protect against the virus. In addition to the H1N1 vaccine, you will need to be  vaccinated for seasonal influenza. The H1N1 and seasonal vaccines may be given on the same day. The CDC especially recommends the H1N1 vaccine for:  Pregnant women.  People who live with or care for children younger than 6 months of age.  Health care and emergency services personnel.  Persons between the ages of 6 months through 24 years of age.  People from ages 25 through 64 years who are at higher risk for H1N1 because of chronic health disorders or immune system problems. FACEMASKS In community and home settings, the use of facemasks and N95 respirators are not normally recommended. In certain circumstances, a facemask or N95 respirator may be used for persons at increased risk of severe illness from influenza. Your caregiver can give additional recommendations for facemask use. IN CHILDREN, EMERGENCY WARNING SIGNS THAT NEED URGENT MEDICAL CARE:  Fast breathing or trouble breathing.  Bluish skin color.  Not drinking enough fluids.  Not waking up or not interacting normally.  Being so fussy that the child does not want to be held.  Your child has an oral temperature above 102 F (38.9 C), not controlled by medicine.  Your baby is older than 3 months with a rectal temperature of 102 F (38.9 C) or higher.  Your baby is 3 months old or younger with a rectal temperature of 100.4 F (38 C) or higher.  Flu-like symptoms improve but then return with fever and worse cough. IN ADULTS, EMERGENCY WARNING SIGNS THAT NEED URGENT MEDICAL CARE:  Difficulty breathing or shortness of breath.  Pain or pressure in the chest or abdomen.  Sudden dizziness.  Confusion.  Severe or persistent vomiting.  Bluish color.  You have a oral temperature above 102 F (38.9 C), not controlled by medicine.  Flu-like symptoms improve but return with fever and worse cough. SEEK IMMEDIATE MEDICAL CARE IF:  You or someone you know is experiencing any of the above symptoms. When you arrive at the  emergency center, report that you think you have the flu. You may be asked to wear a mask and/or sit in a secluded area to protect others from getting sick. MAKE SURE YOU:   Understand these instructions.  Will watch your condition.  Will get help right away if you are not doing well or get worse. Some of this information courtesy of the CDC.  Document Released: 12/15/2007 Document Revised: 09/20/2011 Document Reviewed: 12/15/2007 ExitCare Patient Information 2014 [employer]. 

## 2013-10-31 NOTE — Progress Notes (Signed)
   Subjective:    Patient ID: Alyssa Pham, female    DOB: July 12, 1966, 48 y.o.   MRN: [id number]  Cough This is a new problem. The current episode started in the past 7 days. The problem has been gradually worsening. The problem occurs constantly. The cough is non-productive. Associated symptoms include a fever, headaches, myalgias, nasal congestion, postnasal drip and a sore throat. Pertinent negatives include no chest pain, chills, ear congestion, shortness of breath or wheezing. Nothing aggravates the symptoms. She has tried OTC cough suppressant for the symptoms. The treatment provided no relief.      Review of Systems  Constitutional: Positive for fever and fatigue. Negative for chills, activity change and appetite change.  HENT: Positive for congestion, postnasal drip, sore throat and voice change (nasal).   Respiratory: Positive for cough. Negative for chest tightness, shortness of breath and wheezing.   Cardiovascular: Negative for chest pain.  Musculoskeletal: Positive for myalgias. Negative for back pain.  Neurological: Positive for headaches.       Objective:   Physical Exam  Vitals reviewed. Constitutional: She is oriented to person, place, and time. She appears well-developed and well-nourished. No distress.  HENT:  Head: Normocephalic and atraumatic.  Right Ear: External ear normal.  Left Ear: External ear normal.  Mouth/Throat: Oropharynx is clear and moist. No oropharyngeal exudate.  bilat effusion  Eyes: Conjunctivae are normal. Pupils are equal, round, and reactive to light. Right eye exhibits no discharge. Left eye exhibits no discharge.  Neck: Normal range of motion. Neck supple. No thyromegaly present.  Cardiovascular: Normal rate, regular rhythm and normal heart sounds.   No murmur heard. Pulmonary/Chest: Effort normal and breath sounds normal. No respiratory distress. She has no wheezes. She has no rales.  Lymphadenopathy:    She has no cervical adenopathy.   Neurological: She is alert and oriented to person, place, and time.  Skin: Skin is warm and dry.  Psychiatric: She has a normal mood and affect. Her behavior is normal. Thought content normal.          Assessment & Plan:  1. Cough - benzonatate (TESSALON) 100 MG capsule; Take 1-2 capsules po up to 3 times daily PRN cough  Dispense: 60 capsule; Refill: 0 2. Flu Fever. Myalgias Supportive care. See pt instructions.
